# Patient Record
Sex: Female | Born: 1975 | Hispanic: Yes | Marital: Married | Smoking: Never smoker
Health system: Southern US, Community
[De-identification: ages and names within clinical notes are randomized; demographics above are authoritative.]

## PROBLEM LIST (undated history)

## (undated) DIAGNOSIS — N200 Calculus of kidney: Secondary | ICD-10-CM

## (undated) DIAGNOSIS — N289 Disorder of kidney and ureter, unspecified: Secondary | ICD-10-CM

---

## 2016-06-13 ENCOUNTER — Encounter (HOSPITAL_COMMUNITY): Payer: Self-pay

## 2016-06-13 ENCOUNTER — Emergency Department (HOSPITAL_COMMUNITY)
Admission: EM | Admit: 2016-06-13 | Discharge: 2016-06-14 | Disposition: A | Discharge status: Home / Self Care | Payer: Self-pay | Attending: Emergency Medicine | Admitting: Emergency Medicine

## 2016-06-13 DIAGNOSIS — O4690 Antepartum hemorrhage, unspecified, unspecified trimester: Secondary | ICD-10-CM

## 2016-06-13 DIAGNOSIS — Z3A08 8 weeks gestation of pregnancy: Secondary | ICD-10-CM | POA: Insufficient documentation

## 2016-06-13 DIAGNOSIS — R109 Unspecified abdominal pain: Secondary | ICD-10-CM | POA: Insufficient documentation

## 2016-06-13 DIAGNOSIS — Z79899 Other long term (current) drug therapy: Secondary | ICD-10-CM | POA: Insufficient documentation

## 2016-06-13 DIAGNOSIS — M549 Dorsalgia, unspecified: Secondary | ICD-10-CM | POA: Insufficient documentation

## 2016-06-13 DIAGNOSIS — O200 Threatened abortion: Secondary | ICD-10-CM | POA: Insufficient documentation

## 2016-06-13 HISTORY — DX: Calculus of kidney: N20.0

## 2016-06-13 HISTORY — DX: Disorder of kidney and ureter, unspecified: N28.9

## 2016-06-13 LAB — CBC WITH DIFFERENTIAL/PLATELET
BASOS ABS: 0 10*3/uL (ref 0.0–0.1)
BASOS PCT: 0 %
EOS ABS: 0.2 10*3/uL (ref 0.0–0.7)
Eosinophils Relative: 2 %
HCT: 34.5 % — ABNORMAL LOW (ref 36.0–46.0)
HEMOGLOBIN: 12.1 g/dL (ref 12.0–15.0)
Lymphocytes Relative: 27 %
Lymphs Abs: 2.8 10*3/uL (ref 0.7–4.0)
MCH: 30.3 pg (ref 26.0–34.0)
MCHC: 35.1 g/dL (ref 30.0–36.0)
MCV: 86.5 fL (ref 78.0–100.0)
Monocytes Absolute: 0.7 10*3/uL (ref 0.1–1.0)
Monocytes Relative: 7 %
NEUTROS PCT: 64 %
Neutro Abs: 6.7 10*3/uL (ref 1.7–7.7)
Platelets: 245 10*3/uL (ref 150–400)
RBC: 3.99 MIL/uL (ref 3.87–5.11)
RDW: 13 % (ref 11.5–15.5)
WBC: 10.4 10*3/uL (ref 4.0–10.5)

## 2016-06-13 NOTE — ED Triage Notes (Signed)
Patient states that she is pregnant and bleeding.  Started bleeding today around 3 hours ago.  Having abdominal pain on the right side and in her back.  States that she went to [facility] and they tested her for pregnancy, and it was positive and she has an appointment in 7 weeks. Was not a lot of blood, but she is worried about the pain.

## 2016-06-13 NOTE — ED Provider Notes (Signed)
[facility] Provider Note   CSN: [id number] Arrival date & time: 06/13/16  2225 By signing my name below, I, Yolanda Flynn, attest that this documentation has been prepared under the direction and in the presence of Devoria AlbeIva Javar Eshbach, MD. Electronically Signed: Bridgette HabermannMaria Flynn, ED Scribe. 06/13/16. 11:27 PM.  History   Chief Complaint Chief Complaint  Patient presents with  . Vaginal Bleeding   HPI Comments: Yolanda Flynn is a 40 y.o. female, G4P3Ab0, LNP 05/01/2016 who is currently pregnant who presents to the Emergency Department complaining of sudden onset, intermittent, mild vaginal bleeding around 7 pm tonight. Pt states the blood was not in her undergarments but was on the toilet tissue when she wiped once. Pt also has associated nausea and mid lower back pain.  Pt also complains of sudden onset, mild, intermittent, pressure pain in her lower right abdominal onset one week ago. Each episode lasts about 5 minutes. No alleviating factors tried PTA. Her home pregnancy test was positive one week ago (06/06/2016). Pt went to [facility] on 06/08/16 for another test which was also positive, she has another appointment in 7 weeks. Pt is not on any regular medications, but is now on PNV. Pt is not a smoker, she does not drink alcohol. Denies vomiting, fever, dysuria, urinary frequency, or any other associated symptoms.   [facility]  [phone]  The history is provided by the patient. No language interpreter was used.    Past Medical History:  Diagnosis Date  . Kidney stone   . Renal disorder     There are no active problems to display for this patient.   History reviewed. No pertinent surgical history.  OB History    Gravida Para Term Preterm AB Living   5 3 3    0 3   SAB TAB Ectopic Multiple Live Births           3       Home Medications    Prior to Admission medications   Medication Sig Start Date End Date Taking? Authorizing Provider  Prenatal Vit-Fe Fumarate-FA  (MULTIVITAMIN-PRENATAL) 27-0.8 MG TABS tablet Take 1 tablet by mouth daily at 12 noon.   Yes Historical Provider, MD    Family History No family history on file.  Social History Social History  Substance Use Topics  . Smoking status: Never Smoker  . Smokeless tobacco: Never Used  . Alcohol use No  employed   Allergies   Review of patient's allergies indicates no known allergies.   Review of Systems Review of Systems  Constitutional: Negative for fever.  Gastrointestinal: Positive for abdominal pain and nausea. Negative for vomiting.  Genitourinary: Positive for vaginal bleeding. Negative for dysuria and frequency.  Musculoskeletal: Positive for back pain.  All other systems reviewed and are negative.    Physical Exam Updated Vital Signs BP 113/74 (BP Location: Right Arm)   Pulse 73   Temp 98.9 F (37.2 C) (Oral)   Resp 16   Ht 5' 2.5" (1.588 m)   Wt 147 lb (66.7 kg)   LMP 05/01/2016 (Exact Date)   SpO2 100%   BMI 26.46 kg/m   Vital signs normal    Physical Exam  Constitutional: She is oriented to person, place, and time. She appears well-developed and well-nourished.  Non-toxic appearance. She does not appear ill. No distress.  HENT:  Head: Normocephalic and atraumatic.  Right Ear: External ear normal.  Left Ear: External ear normal.  Nose: Nose normal. No mucosal edema or rhinorrhea.  Mouth/Throat: Oropharynx is clear and moist and mucous membranes are normal. No dental abscesses or uvula swelling.  Eyes: Conjunctivae and EOM are normal. Pupils are equal, round, and reactive to light.  Neck: Normal range of motion and full passive range of motion without pain. Neck supple.  Cardiovascular: Normal rate, regular rhythm and normal heart sounds.  Exam reveals no gallop and no friction rub.   No murmur heard. Pulmonary/Chest: Effort normal and breath sounds normal. No respiratory distress. She has no wheezes. She has no rhonchi. She has no rales. She exhibits no  tenderness and no crepitus.  Abdominal: Soft. Normal appearance and bowel sounds are normal. She exhibits no distension. There is no tenderness. There is no rebound and no guarding.    Nontender but indicates she has pain in the RLQ. Area of pain noted.  Genitourinary:  Genitourinary Comments: Chaperone present throughout entire exam. Patient had trace amount of blood in the cervical os. Her cervix had the bluish tent to it system with pregnancy. Her uterus was nontender and did not feel enlarged. Her left adnexa was tender without masses, her right adnexa was nontender.  Musculoskeletal: Normal range of motion. She exhibits tenderness. She exhibits no edema.       Back:  Tender in the midline sacral area.  Neurological: She is alert and oriented to person, place, and time. She has normal strength. No cranial nerve deficit.  Skin: Skin is warm, dry and intact. No rash noted. No erythema. No pallor.  Psychiatric: She has a normal mood and affect. Her speech is normal and behavior is normal. Her mood appears not anxious.  Nursing note and vitals reviewed.    ED Treatments / Results  DIAGNOSTIC STUDIES: Oxygen Saturation is 100% on RA, normal by my interpretation.      Labs (all labs ordered are listed, but only abnormal results are displayed) Results for orders placed or performed during the hospital encounter of 06/13/16  Wet prep, genital  Result Value Ref Range   Yeast Wet Prep HPF POC NONE SEEN NONE SEEN   Trich, Wet Prep NONE SEEN NONE SEEN   Clue Cells Wet Prep HPF POC PRESENT NONE SEEN   WBC, Wet Prep HPF POC RARE NONE SEEN   Sperm NONE SEEN   CBC with Differential  Result Value Ref Range   WBC 10.4 4.0 - 10.5 K/uL   RBC 3.99 3.87 - 5.11 MIL/uL   Hemoglobin 12.1 12.0 - 15.0 g/dL   HCT 16.1 (L) 09.6 - 04.5 %   MCV 86.5 78.0 - 100.0 fL   MCH 30.3 26.0 - 34.0 pg   MCHC 35.1 30.0 - 36.0 g/dL   RDW 40.9 81.1 - 91.4 %   Platelets 245 150 - 400 K/uL   Neutrophils Relative %  64 %   Neutro Abs 6.7 1.7 - 7.7 K/uL   Lymphocytes Relative 27 %   Lymphs Abs 2.8 0.7 - 4.0 K/uL   Monocytes Relative 7 %   Monocytes Absolute 0.7 0.1 - 1.0 K/uL   Eosinophils Relative 2 %   Eosinophils Absolute 0.2 0.0 - 0.7 K/uL   Basophils Relative 0 %   Basophils Absolute 0.0 0.0 - 0.1 K/uL  hCG, quantitative, pregnancy  Result Value Ref Range   hCG, Beta Chain, Quant, S 46,556 (H) <5 mIU/mL  Urinalysis, Routine w reflex microscopic  Result Value Ref Range   Color, Urine YELLOW YELLOW   APPearance CLEAR CLEAR   Specific Gravity, Urine 1.020 1.005 - 1.030  pH 7.0 5.0 - 8.0   Glucose, UA NEGATIVE NEGATIVE mg/dL   Hgb urine dipstick NEGATIVE NEGATIVE   Bilirubin Urine NEGATIVE NEGATIVE   Ketones, ur NEGATIVE NEGATIVE mg/dL   Protein, ur NEGATIVE NEGATIVE mg/dL   Nitrite NEGATIVE NEGATIVE   Leukocytes, UA NEGATIVE NEGATIVE  ABO/Rh  Result Value Ref Range   ABO/RH(D) O POS    Laboratory interpretation all normal except + Pregnancy       Radiology Koreas Ob Comp Less 14 Wks  [place] Ob Transvaginal  Result Date: 06/14/2016 CLINICAL DATA:  Pregnant patient in first-trimester pregnancy with vaginal bleeding and right lower quadrant pain. EXAM: OBSTETRIC <14 WK US AND TRANSVAGINAL OB US TECHNIQUE: Both transabdominal and transvaginal ultrasound examinations were performed for complete evaluation of the gestation as well as the maternal uterus, adnexal regions, and pelvic cul-de-sac. Transvaginal technique was performed to assess early pregnancy. COMPARISON:  None. FINDINGS: Intrauterine gestational sac: Single Yolk sac:  Present. Embryo:  Present. Cardiac Activity: Present. Heart Rate: 165  bpm CRL:  3.7  mm   6 w   0 d                  US EDC: 02/07/2017 Subchorionic hemorrhage:  None visualized. Maternal uterus/adnexae: The right ovary is not visualized. There is a simple cyst in the left ovary measuring 2.9 cm. No pelvic free fluid. IMPRESSION: Single live intrauterine pregnancy  estimated gestational age [redacted] weeks 0 days for estimated date of delivery 01/28/2017. No subchorionic hemorrhage. Electronically Signed   By: Rubye OaksMelanie  Ehinger M.D.   On: 06/14/2016 01:47    Procedures Procedures (including critical care time)  Medications Ordered in ED Medications - No data to display   Initial Impression / Assessment and Plan / ED Course  I have reviewed the triage vital signs and the nursing notes.  Pertinent labs & imaging results that were available during my care of the patient were reviewed by me and considered in my medical decision making (see chart for details).  Clinical Course   11:25 PM Discussed treatment plan with pt at bedside which includes blood work, urinalysis, and pelvic exam and pt agreed to plan.   12:33 AM 06/14/16 Bedside Ultrasound performed  that indicates possible pregnancy.  Will request formal ultrasound.  -2:35 AM pt given her US results. Discussed reasons to return to the ED (heavy bleeding, severe pain, passing clots). Advised to not douche, use tampons or have sex until the OB states she can.   EMERGENCY DEPARTMENT US PREGNANCY "Study: Limited Ultrasound of the Pelvis"  INDICATIONS:Pregnancy(required), Vaginal bleeding and Pelvic pain Multiple views of the uterus and pelvic cavity are obtained with a multi-frequency probe.  APPROACH:Transabdominal   PERFORMED BY: Myself  IMAGES ARCHIVED?: Yes  LIMITATIONS: none  PREGNANCY FREE FLUID: None  PREGNANCY UTERUS FINDINGS:Gestational sac noted   PREGNANCY FINDINGS: Intrauterine gestational sac noted, No fetal heart activity seen and No fetal pole seen   GESTATIONAL AGE, ESTIMATE: 6 weeks 4 days  FETAL HEART RATE: none  COMMENT Radiology US ordered    Final Clinical Impressions(s) / ED Diagnoses   Final diagnoses:  Threatened miscarriage    Plan discharge  Devoria AlbeIva Lacorey Brusca, MD, FACEP  I personally performed the services described in this documentation, which was scribed  in my presence. The recorded information has been reviewed and considered.  Devoria AlbeIva Marceline Napierala, MD, Concha PyoFACEP    Solara Goodchild, MD 06/14/16 [id number]

## 2016-06-14 ENCOUNTER — Emergency Department (HOSPITAL_COMMUNITY): Payer: Self-pay

## 2016-06-14 LAB — URINALYSIS, ROUTINE W REFLEX MICROSCOPIC
Bilirubin Urine: NEGATIVE
GLUCOSE, UA: NEGATIVE mg/dL
Hgb urine dipstick: NEGATIVE
KETONES UR: NEGATIVE mg/dL
LEUKOCYTES UA: NEGATIVE
Nitrite: NEGATIVE
PH: 7 (ref 5.0–8.0)
Protein, ur: NEGATIVE mg/dL
SPECIFIC GRAVITY, URINE: 1.02 (ref 1.005–1.030)

## 2016-06-14 LAB — WET PREP, GENITAL
Sperm: NONE SEEN
Trich, Wet Prep: NONE SEEN
Yeast Wet Prep HPF POC: NONE SEEN

## 2016-06-14 LAB — ABO/RH: ABO/RH(D): O POS

## 2016-06-14 LAB — HCG, QUANTITATIVE, PREGNANCY: hCG, Beta Chain, Quant, S: 46556 m[IU]/mL — ABNORMAL HIGH (ref <5)

## 2016-06-14 NOTE — ED Notes (Signed)
PT returned from ultrasound,

## 2016-06-14 NOTE — Discharge Instructions (Signed)
No sex, tampons, douching until you are seen by the obstetrician for your prenatal care.  Continue your prenatal vitamins. Return to the ED if you have bleeding heavy or heavier than a period, you get constant severe abdominal pain or you start passing clots.  You can safely take acetaminophen for pain if needed.

## 2016-06-15 LAB — GC/CHLAMYDIA PROBE AMP (~~LOC~~) NOT AT ARMC
CHLAMYDIA, DNA PROBE: NEGATIVE
Neisseria Gonorrhea: NEGATIVE

## 2017-01-13 IMAGING — US US OB TRANSVAGINAL
1 series · 14 of 28 positions shown · non-contrast
Comparison: None.

CLINICAL DATA: Pregnant patient in first-trimester pregnancy with
vaginal bleeding and right lower quadrant pain.

EXAM:
OBSTETRIC <14 WK US AND TRANSVAGINAL OB US
TECHNIQUE: Both transabdominal and transvaginal ultrasound examinations were
performed for complete evaluation of the gestation as well as the
maternal uterus, adnexal regions, and pelvic cul-de-sac.
Transvaginal technique was performed to assess early pregnancy.

[Series 1: us ob transvaginal · 0.23mm/px · 14 of 71 slices shown]
[im 3/71]
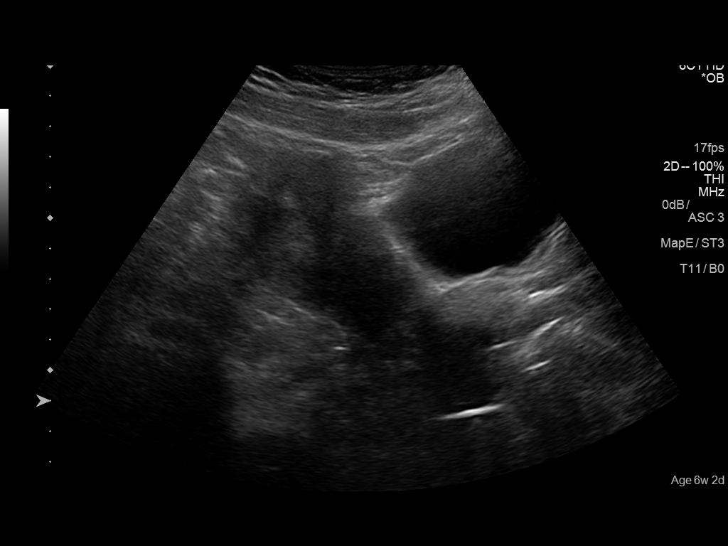
[im 8/71]
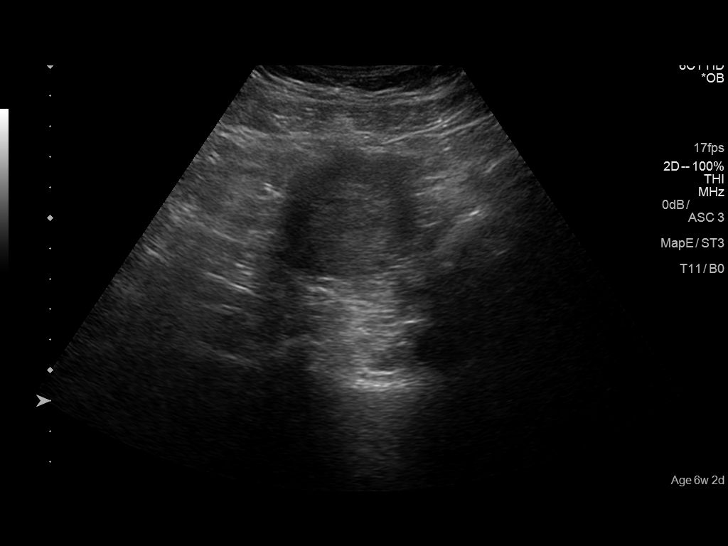
[im 13/71]
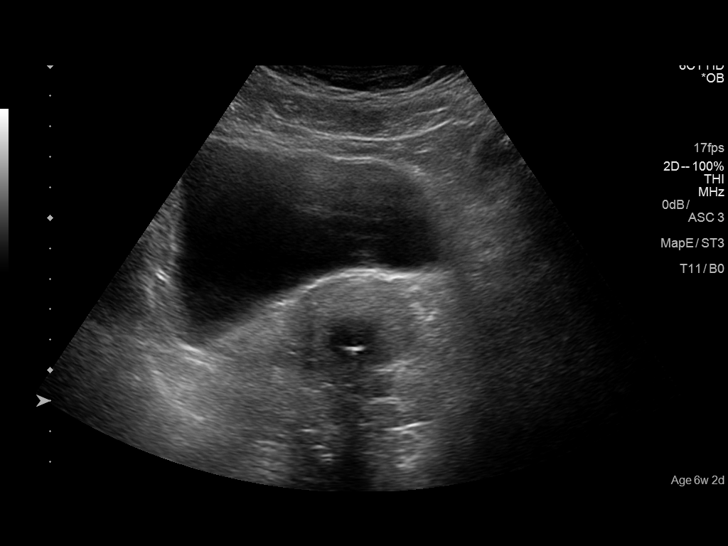
[im 19/71]
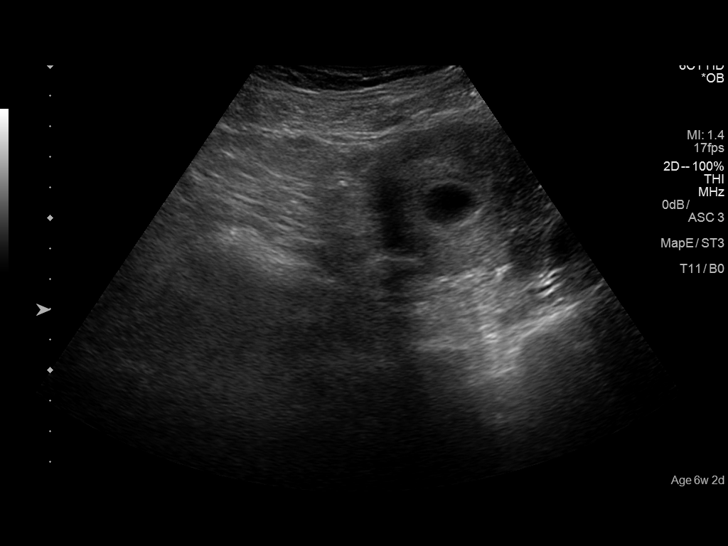
[im 24/71]
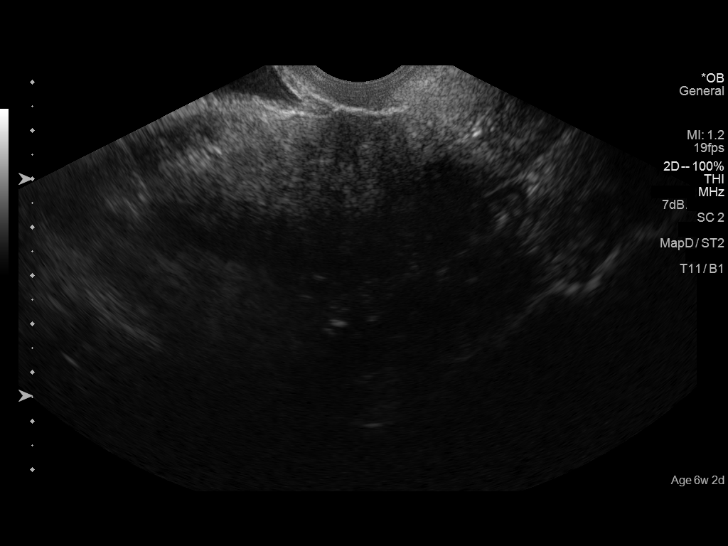
[im 29/71]
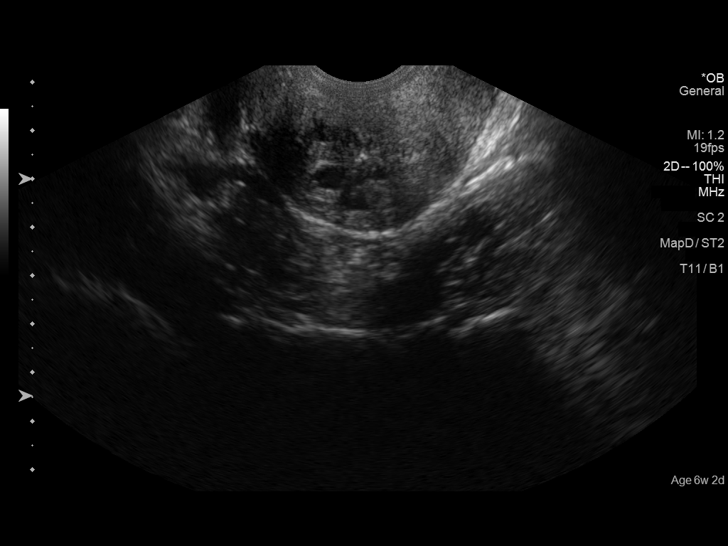
[im 34/71]
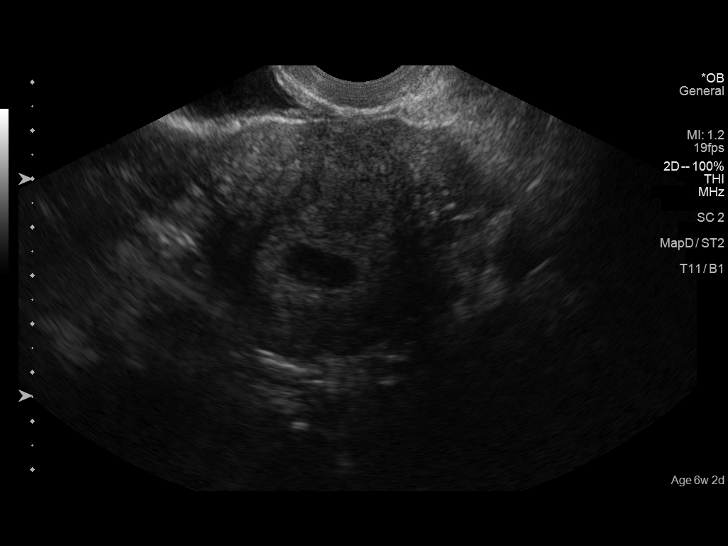
[im 39/71]
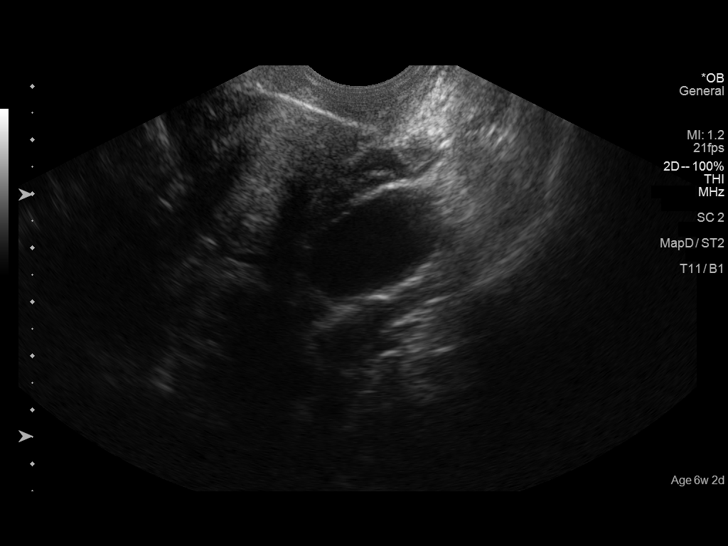
[im 45/71]
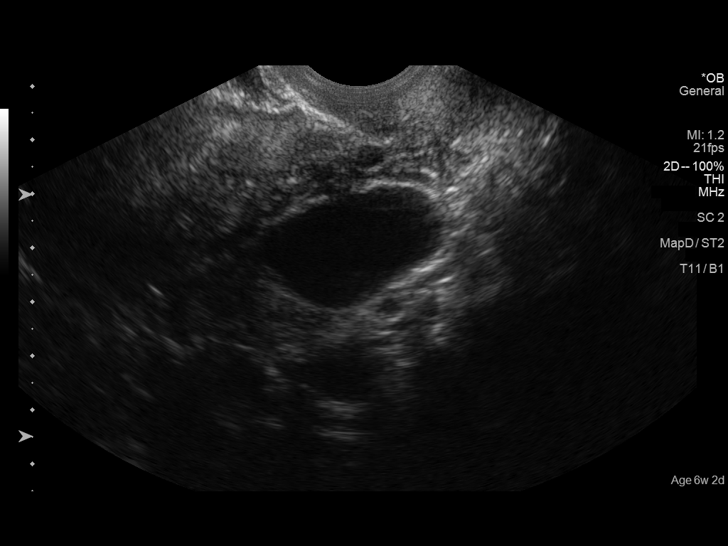
[im 50/71]
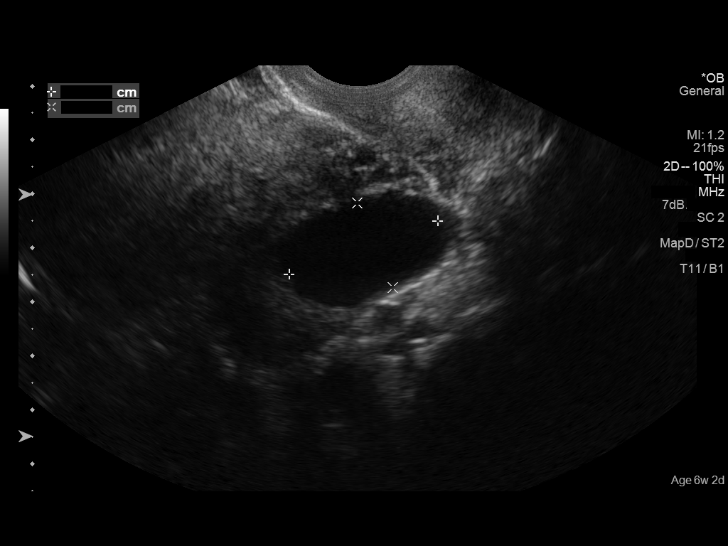
[im 55/71]
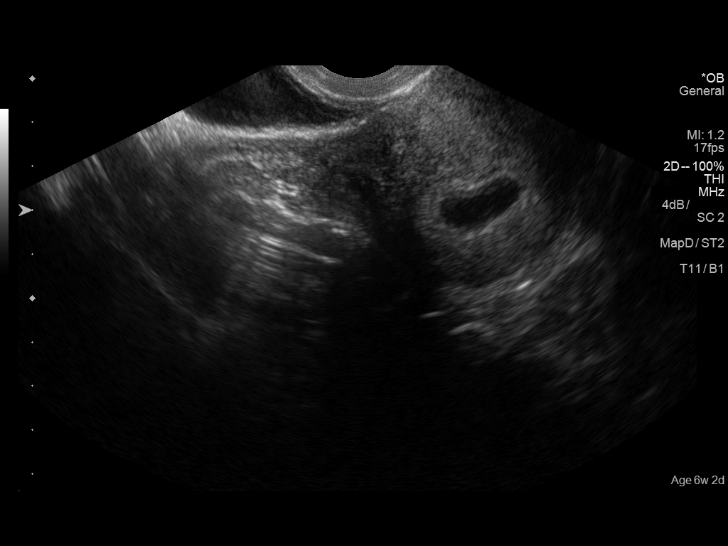
[im 60/71]
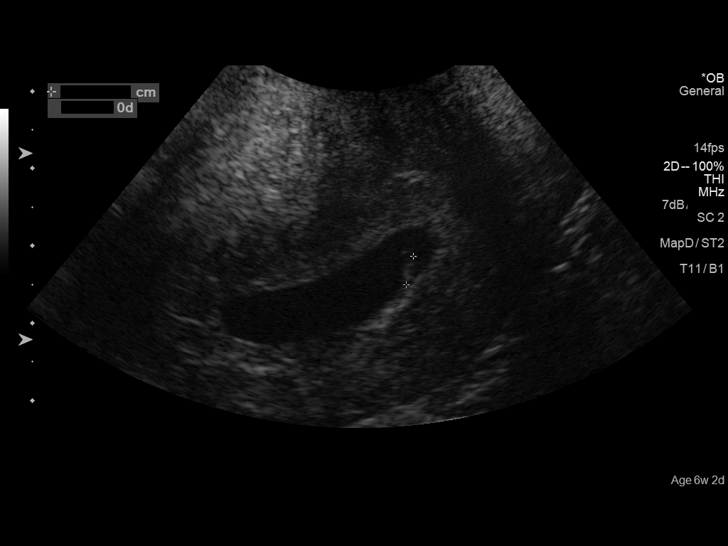
[im 65/71]
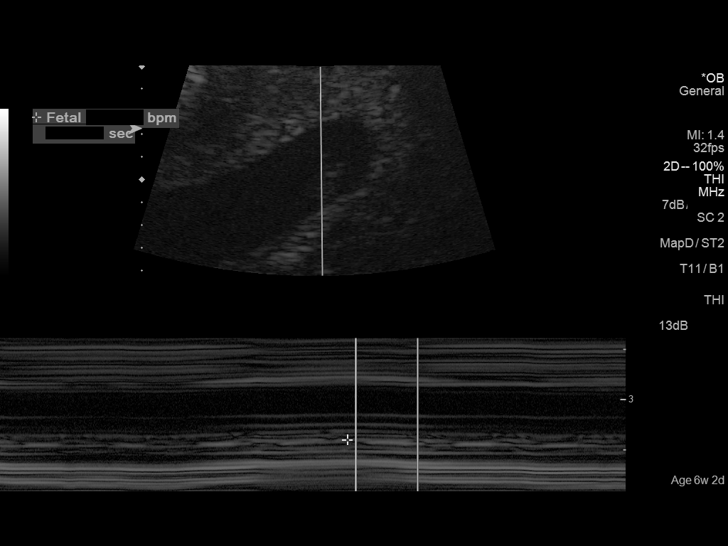
[im 71/71]
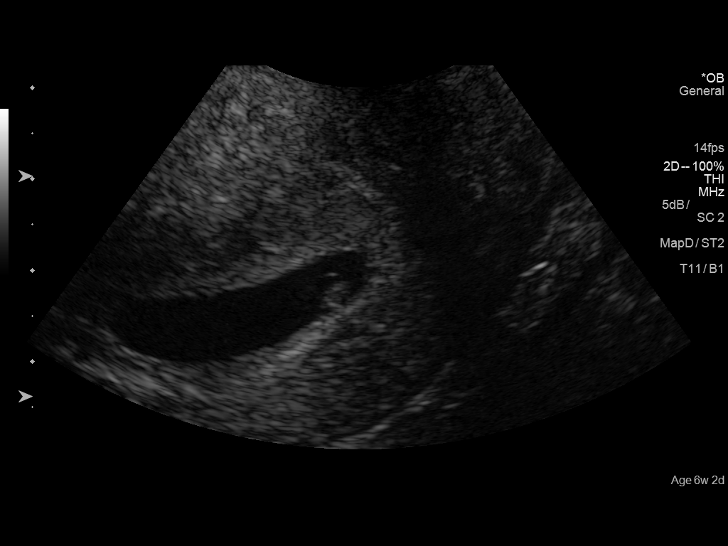

[14 of 28 positions shown; findings below may reference images not displayed]

FINDINGS: Intrauterine gestational sac: Single

Yolk sac:  Present.

Embryo:  Present.

Cardiac Activity: Present.

Heart Rate: 165  bpm

CRL:  3.7  mm   6 w   0 d                  US EDC: 02/07/2017

Subchorionic hemorrhage:  None visualized.

Maternal uterus/adnexae: The right ovary is not visualized. There is
a simple cyst in the left ovary measuring 2.9 cm. No pelvic free
fluid.
IMPRESSION: Single live intrauterine pregnancy estimated gestational age 6 weeks
0 days for estimated date of delivery 01/28/2017. No subchorionic
hemorrhage.

## 2022-06-25 ENCOUNTER — Ambulatory Visit
Admission: EM | Admit: 2022-06-25 | Discharge: 2022-06-25 | Disposition: A | Discharge status: Home / Self Care | Payer: Self-pay

## 2022-06-25 DIAGNOSIS — B029 Zoster without complications: Secondary | ICD-10-CM

## 2022-06-25 MED ORDER — VALACYCLOVIR HCL 1 G PO TABS: 1000.0000 mg | ORAL_TABLET | Freq: Three times a day (TID) | ORAL | 0 refills | Status: AC

## 2022-06-25 NOTE — ED Triage Notes (Signed)
Pt reports left arm pain and small bumps in left arm x 2 weeks. Advil gives some relief. Pain is worse when moving arm.

## 2022-06-25 NOTE — ED Provider Notes (Signed)
[facility]    CSN: [id number] Arrival date & time: 06/25/22  1505      History   Chief Complaint Chief Complaint  Patient presents with   Arm Pain    HPI Yolanda Flynn is a 46 y.o. female.   Patient presents with medical interpreter for pain in the left arm that began yesterday.  Reports there is pain on the backside of her arm and going down into her upper left arm.  Denies any recent accident, fall, injury, or trauma to the arm.  She does have some baseline numbness and tingling in both of her upper extremities on her fingertips.  She also endorses rash to the posterior arm that she developed while in the waiting room today.  Reports her arm is very sensitive to touch.    Past Medical History:  Diagnosis Date   Kidney stone    Renal disorder     There are no problems to display for this patient.   History reviewed. No pertinent surgical history.  OB History     Gravida  5   Para  3   Term  3   Preterm      AB  0   Living  3      SAB      IAB      Ectopic      Multiple      Live Births  3            Home Medications    Prior to Admission medications   Medication Sig Start Date End Date Taking? Authorizing Provider  ibuprofen (ADVIL) 200 MG tablet Take 200 mg by mouth every 6 (six) hours as needed.   Yes [provider]  Multiple Vitamin (MULTIVITAMIN) tablet Take 1 tablet by mouth daily.   Yes [provider]  valACYclovir (VALTREX) 1000 MG tablet Take 1 tablet (1,000 mg total) by mouth 3 (three) times daily for 10 days. 06/25/22 07/05/22 Yes Valentino Nose, NP  Prenatal Vit-Fe Fumarate-FA (MULTIVITAMIN-PRENATAL) 27-0.8 MG TABS tablet Take 1 tablet by mouth daily at 12 noon.    [provider]    Family History History reviewed. No pertinent family history.  Social History Social History   Tobacco Use   Smoking status: Never   Smokeless tobacco: Never  Substance Use Topics   Alcohol  use: No   Drug use: No     Allergies   Patient has no known allergies.   Review of Systems Review of Systems Per HPI  Physical Exam Triage Vital Signs ED Triage Vitals  Enc Vitals Group     BP 06/25/22 1522 109/70     Pulse Rate 06/25/22 1522 78     Resp 06/25/22 1522 16     Temp 06/25/22 1522 98.3 F (36.8 C)     Temp Source 06/25/22 1522 Oral     SpO2 06/25/22 1522 97 %     Weight --      Height --      Head Circumference --      Peak Flow --      Pain Score 06/25/22 1520 8     Pain Loc --      Pain Edu? --      Excl. in GC? --    No data found.  Updated Vital Signs BP 109/70 (BP Location: Right Arm)   Pulse 78   Temp 98.3 F (36.8 C) (Oral)   Resp 16  SpO2 97%   Breastfeeding No   Visual Acuity Right Eye Distance:   Left Eye Distance:   Bilateral Distance:    Right Eye Near:   Left Eye Near:    Bilateral Near:     Physical Exam Vitals and nursing note reviewed.  Constitutional:      General: She is not in acute distress.    Appearance: Normal appearance. She is not toxic-appearing.  HENT:     Head: Normocephalic and atraumatic.     Mouth/Throat:     Mouth: Mucous membranes are moist.     Pharynx: Oropharynx is clear.  Pulmonary:     Effort: Pulmonary effort is normal. No respiratory distress.  Skin:    Capillary Refill: Capillary refill takes less than 2 seconds.     Findings: Rash present. Rash is papular and vesicular.          Comments: Papular, early vesicular rash noted to posterior arm in approximately area marked.  The rash is erythematous.  No surrounding erythema, fluctuance, warmth.  No active blisters or drainage.  Skin sensitivity travels down into upper left arm.  No rash noted to upper left arm.  Neurological:     Mental Status: She is alert and oriented to person, place, and time.  Psychiatric:        Behavior: Behavior is cooperative.      UC Treatments / Results  Labs (all labs ordered are listed, but only abnormal  results are displayed) Labs Reviewed - No data to display  EKG   Radiology No results found.  Procedures Procedures (including critical care time)  Medications Ordered in UC Medications - No data to display  Initial Impression / Assessment and Plan / UC Course  I have reviewed the triage vital signs and the nursing notes.  Pertinent labs & imaging results that were available during my care of the patient were reviewed by me and considered in my medical decision making (see chart for details).    Patient is a very pleasant, well-appearing 46 year old female presenting for left arm pain and rash.  In triage, she is normotensive, not tachycardic, not tachypneic, afebrile, and oxygenating well on room air.  Examination history consistent with shingles.  Start Valtrex 1000 mg 3 times daily for 10 days to treat new lesions.  Discussed use of Tylenol/Motrin as needed for pain.  Recommended covering rash if blisters develop and open.  Handout given in Spanish.  ER precautions and return precautions discussed.  The patient was given the opportunity to ask questions.  All questions answered to their satisfaction.  The patient is in agreement to this plan.   Final Clinical Impressions(s) / UC Diagnoses   Final diagnoses:  Herpes zoster without complication     Discharge Instructions      - Your arm pain is from the rash that is coming up on your skin called shingles - You can use Tylenol/Motrin as needed for pain - If the rash develops blisters and if they open/ooze, cover them up to prevent spreading shingles - Start the valtrex 1000 mg three times per day to prevent new rash from developing    ED Prescriptions     Medication Sig Dispense Auth. Provider   valACYclovir (VALTREX) 1000 MG tablet Take 1 tablet (1,000 mg total) by mouth 3 (three) times daily for 10 days. 30 tablet Valentino Nose, NP      PDMP not reviewed this encounter.   Valentino Nose, NP 06/25/22  [phone]

## 2022-06-25 NOTE — Discharge Instructions (Addendum)
-   Your arm pain is from the rash that is coming up on your skin called shingles - You can use Tylenol/Motrin as needed for pain - If the rash develops blisters and if they open/ooze, cover them up to prevent spreading shingles - Start the valtrex 1000 mg three times per day to prevent new rash from developing

## 2024-06-21 ENCOUNTER — Other Ambulatory Visit: Payer: Self-pay

## 2024-06-21 ENCOUNTER — Ambulatory Visit
Admission: EM | Admit: 2024-06-21 | Discharge: 2024-06-21 | Disposition: A | Discharge status: Home / Self Care | Payer: Self-pay | Attending: Family Medicine | Admitting: Family Medicine

## 2024-06-21 DIAGNOSIS — R110 Nausea: Secondary | ICD-10-CM

## 2024-06-21 DIAGNOSIS — R42 Dizziness and giddiness: Secondary | ICD-10-CM

## 2024-06-21 LAB — GLUCOSE, POCT (MANUAL RESULT ENTRY): POCT Glucose (KUC): 132 mg/dL — AB (ref 70–99)

## 2024-06-21 MED ORDER — MECLIZINE HCL 25 MG PO TABS: 25.0000 mg | ORAL_TABLET | Freq: Three times a day (TID) | ORAL | 0 refills | Status: AC | PRN

## 2024-06-21 NOTE — Discharge Instructions (Addendum)
 Your workup today was very reassuring with normal vital signs, EKG, and full exam.  Your blood sugar was also reassuring.  It is unclear exactly what is causing your dizziness, however this could be a vertigo type issue so we will try some meclizine  and rest and follow-up with your primary care provider for a recheck.  If symptoms severely worsen at any time.  Go to the emergency department.

## 2024-06-21 NOTE — ED Provider Notes (Signed)
 [facility]    CSN: [id number] Arrival date & time: 06/21/24  1647      History   Chief Complaint No chief complaint on file.   HPI Yolanda Flynn is a 48 y.o. female.   Medical interpreter utilized today to facilitate visit with patient's consent.  She states she has a 3-day history of intermittent dizziness, ringing in ears, muffled hearing, and now today also dry mouth and nausea.  She denies head injury, headache, visual change, photophobia, mental status changes, weakness numbness or tingling of extremities, shortness of breath, chest pain but when specifically asked does note that she felt a bit of heart racing earlier today that has now resolved.  No past history of similar issues.  So far not trying anything over-the-counter for symptoms.    Past Medical History:  Diagnosis Date   Kidney stone    Renal disorder     There are no active problems to display for this patient.   History reviewed. No pertinent surgical history.  OB History     Gravida  5   Para  3   Term  3   Preterm      AB  0   Living  3      SAB      IAB      Ectopic      Multiple      Live Births  3            Home Medications    Prior to Admission medications   Medication Sig Start Date End Date Taking? Authorizing Provider  meclizine  (ANTIVERT ) 25 MG tablet Take 1 tablet (25 mg total) by mouth 3 (three) times daily as needed for dizziness. May cause drowsiness 06/21/24  Yes Stuart Vernell Norris, PA-C  ibuprofen (ADVIL) 200 MG tablet Take 200 mg by mouth every 6 (six) hours as needed.    [provider]  Multiple Vitamin (MULTIVITAMIN) tablet Take 1 tablet by mouth daily.    [provider]  Prenatal Vit-Fe Fumarate-FA (MULTIVITAMIN-PRENATAL) 27-0.8 MG TABS tablet Take 1 tablet by mouth daily at 12 noon.    [provider]    Family History History reviewed. No pertinent family history.  Social History Social History    Tobacco Use   Smoking status: Never   Smokeless tobacco: Never  Substance Use Topics   Alcohol use: No   Drug use: No     Allergies   Patient has no known allergies.   Review of Systems Review of Systems Per HPI  Physical Exam Triage Vital Signs ED Triage Vitals  Encounter Vitals Group     BP 06/21/24 1747 117/76     Girls Systolic BP Percentile --      Girls Diastolic BP Percentile --      Boys Systolic BP Percentile --      Boys Diastolic BP Percentile --      Pulse Rate 06/21/24 1747 67     Resp 06/21/24 1747 20     Temp 06/21/24 1747 97.8 F (36.6 C)     Temp Source 06/21/24 1747 Oral     SpO2 06/21/24 1747 96 %     Weight --      Height --      Head Circumference --      Peak Flow --      Pain Score 06/21/24 1750 0     Pain Loc --      Pain Education --  Exclude from Growth Chart --    Orthostatic VS for the past 24 hrs:  BP- Lying Pulse- Lying BP- Sitting Pulse- Sitting BP- Standing at 0 minutes Pulse- Standing at 0 minutes  06/21/24 1832 119/79 65 121/77 68 113/76 76    Updated Vital Signs BP 117/76 (BP Location: Right Arm)   Pulse 67   Temp 97.8 F (36.6 C) (Oral)   Resp 20   SpO2 96%   Visual Acuity Right Eye Distance:   Left Eye Distance:   Bilateral Distance:    Right Eye Near:   Left Eye Near:    Bilateral Near:     Physical Exam Vitals and nursing note reviewed.  Constitutional:      Appearance: Normal appearance. She is not ill-appearing.  HENT:     Head: Atraumatic.     Right Ear: Tympanic membrane normal.     Left Ear: Tympanic membrane normal.     Mouth/Throat:     Mouth: Mucous membranes are moist.     Pharynx: Oropharynx is clear.  Eyes:     Extraocular Movements: Extraocular movements intact.     Conjunctiva/sclera: Conjunctivae normal.  Cardiovascular:     Rate and Rhythm: Normal rate and regular rhythm.     Heart sounds: Normal heart sounds.  Pulmonary:     Effort: Pulmonary effort is normal.     Breath  sounds: Normal breath sounds.  Musculoskeletal:        General: Normal range of motion.     Cervical back: Normal range of motion and neck supple.  Skin:    General: Skin is warm and dry.  Neurological:     General: No focal deficit present.     Mental Status: She is alert and oriented to person, place, and time.     Cranial Nerves: No cranial nerve deficit.     Motor: No weakness.     Gait: Gait normal.  Psychiatric:        Mood and Affect: Mood normal.        Thought Content: Thought content normal.        Judgment: Judgment normal.      UC Treatments / Results  Labs (all labs ordered are listed, but only abnormal results are displayed) Labs Reviewed  GLUCOSE, POCT (MANUAL RESULT ENTRY) - Abnormal; Notable for the following components:      Result Value   POCT Glucose ([facility]) 132 (*)    All other components within normal limits    EKG   Radiology No results found.  Procedures Procedures (including critical care time)  Medications Ordered in UC Medications - No data to display  Initial Impression / Assessment and Plan / UC Course  I have reviewed the triage vital signs and the nursing notes.  Pertinent labs & imaging results that were available during my care of the patient were reviewed by me and considered in my medical decision making (see chart for details).     Vital signs within normal limits, she is very well-appearing and in no distress and with no focal neurologic deficits or other obvious abnormalities on exam.  Point-of-care glucose random is 132, orthostatic vital signs without significant change, EKG today normal sinus rhythm without acute ST or T wave changes.  Discussed that exam was very reassuring today with no obvious definitive cause of her dizziness symptoms.  Possibly an inner ear vestibular cause.  Trial meclizine , supportive over-the-counter medications and home care and follow-up with primary care soon as  possible.  ER for worsening symptoms at  any time.  Final Clinical Impressions(s) / UC Diagnoses   Final diagnoses:  Dizziness  Nausea without vomiting     Discharge Instructions      Your workup today was very reassuring with normal vital signs, EKG, and full exam.  Your blood sugar was also reassuring.  It is unclear exactly what is causing your dizziness, however this could be a vertigo type issue so we will try some meclizine  and rest and follow-up with your primary care provider for a recheck.  If symptoms severely worsen at any time.  Go to the emergency department.    ED Prescriptions     Medication Sig Dispense Auth. Provider   meclizine  (ANTIVERT ) 25 MG tablet Take 1 tablet (25 mg total) by mouth 3 (three) times daily as needed for dizziness. May cause drowsiness 20 tablet Stuart Vernell Norris, [place]      PDMP not reviewed this encounter.   Stuart Vernell Norris, [place] 06/21/24 [id number]

## 2024-06-21 NOTE — ED Triage Notes (Signed)
 Pt reports dizziness x 3 days, intermittently, today has been constant. Dry mouth. When dizziness occurs there feels like there is a ringing in the ears.

## 2024-06-21 NOTE — ED Notes (Signed)
 Pt reports last ate right before coming to UC.  Denied dizziness since being at [facility] or during orthostatic vitals being obtained.
